# Patient Record
Sex: Female | Born: 1960 | Race: White | Hispanic: No | Marital: Married | Smoking: Never smoker
Health system: Southern US, Community
[De-identification: ages and names within clinical notes are randomized; demographics above are authoritative.]

---

## 2000-07-30 ENCOUNTER — Other Ambulatory Visit: Admission: RE | Admit: 2000-07-30 | Discharge: 2000-07-30 | Payer: Self-pay | Admitting: Obstetrics and Gynecology

## 2000-08-22 ENCOUNTER — Ambulatory Visit (HOSPITAL_COMMUNITY): Admission: RE | Admit: 2000-08-22 | Discharge: 2000-08-22 | Payer: Self-pay | Admitting: Obstetrics and Gynecology

## 2000-08-22 ENCOUNTER — Encounter: Payer: Self-pay | Admitting: Obstetrics and Gynecology

## 2000-09-06 ENCOUNTER — Ambulatory Visit (HOSPITAL_COMMUNITY): Admission: RE | Admit: 2000-09-06 | Discharge: 2000-09-06 | Payer: Self-pay | Admitting: Obstetrics and Gynecology

## 2000-11-22 ENCOUNTER — Encounter: Payer: Self-pay | Admitting: Family Medicine

## 2000-11-22 ENCOUNTER — Ambulatory Visit (HOSPITAL_COMMUNITY): Admission: RE | Admit: 2000-11-22 | Discharge: 2000-11-22 | Payer: Self-pay | Admitting: Family Medicine

## 2001-08-01 ENCOUNTER — Other Ambulatory Visit: Admission: RE | Admit: 2001-08-01 | Discharge: 2001-08-01 | Payer: Self-pay | Admitting: Obstetrics and Gynecology

## 2002-09-11 ENCOUNTER — Other Ambulatory Visit: Admission: RE | Admit: 2002-09-11 | Discharge: 2002-09-11 | Payer: Self-pay | Admitting: Obstetrics and Gynecology

## 2003-07-23 ENCOUNTER — Ambulatory Visit (HOSPITAL_COMMUNITY): Admission: RE | Admit: 2003-07-23 | Discharge: 2003-07-23 | Payer: Self-pay | Admitting: Family Medicine

## 2003-09-24 ENCOUNTER — Other Ambulatory Visit: Admission: RE | Admit: 2003-09-24 | Discharge: 2003-09-24 | Payer: Self-pay | Admitting: Obstetrics and Gynecology

## 2004-09-06 ENCOUNTER — Ambulatory Visit (HOSPITAL_BASED_OUTPATIENT_CLINIC_OR_DEPARTMENT_OTHER): Admission: RE | Admit: 2004-09-06 | Discharge: 2004-09-06 | Payer: Self-pay | Admitting: Orthopedic Surgery

## 2004-11-15 ENCOUNTER — Other Ambulatory Visit: Admission: RE | Admit: 2004-11-15 | Discharge: 2004-11-15 | Payer: Self-pay | Admitting: Obstetrics and Gynecology

## 2005-04-19 ENCOUNTER — Other Ambulatory Visit: Admission: RE | Admit: 2005-04-19 | Discharge: 2005-04-19 | Payer: Self-pay | Admitting: Obstetrics and Gynecology

## 2005-11-16 ENCOUNTER — Other Ambulatory Visit: Admission: RE | Admit: 2005-11-16 | Discharge: 2005-11-16 | Payer: Self-pay | Admitting: Obstetrics and Gynecology

## 2006-01-22 ENCOUNTER — Ambulatory Visit (HOSPITAL_COMMUNITY): Admission: RE | Admit: 2006-01-22 | Discharge: 2006-01-23 | Payer: Self-pay | Admitting: Obstetrics and Gynecology

## 2011-06-26 ENCOUNTER — Other Ambulatory Visit: Payer: Self-pay | Admitting: Family Medicine

## 2013-10-20 ENCOUNTER — Other Ambulatory Visit: Payer: Self-pay | Admitting: Obstetrics & Gynecology

## 2014-02-22 ENCOUNTER — Other Ambulatory Visit: Payer: Self-pay | Admitting: Family Medicine

## 2014-02-22 ENCOUNTER — Ambulatory Visit
Admission: RE | Admit: 2014-02-22 | Discharge: 2014-02-22 | Disposition: A | Discharge status: Home / Self Care | Payer: BC Managed Care – PPO | Source: Ambulatory Visit | Attending: Family Medicine | Admitting: Family Medicine

## 2014-02-22 DIAGNOSIS — S6390XA Sprain of unspecified part of unspecified wrist and hand, initial encounter: Secondary | ICD-10-CM

## 2015-04-27 IMAGING — CR DG HAND COMPLETE 3+V*R*
3 series · 3 of 3 positions shown · non-contrast
Comparison: None

CLINICAL DATA: Kicked by a horse 2 days ago, pain and swelling
across top of hand, sprain and strain

EXAM:
RIGHT HAND - COMPLETE 3+ VIEW

[view not recorded (1 of 3)]
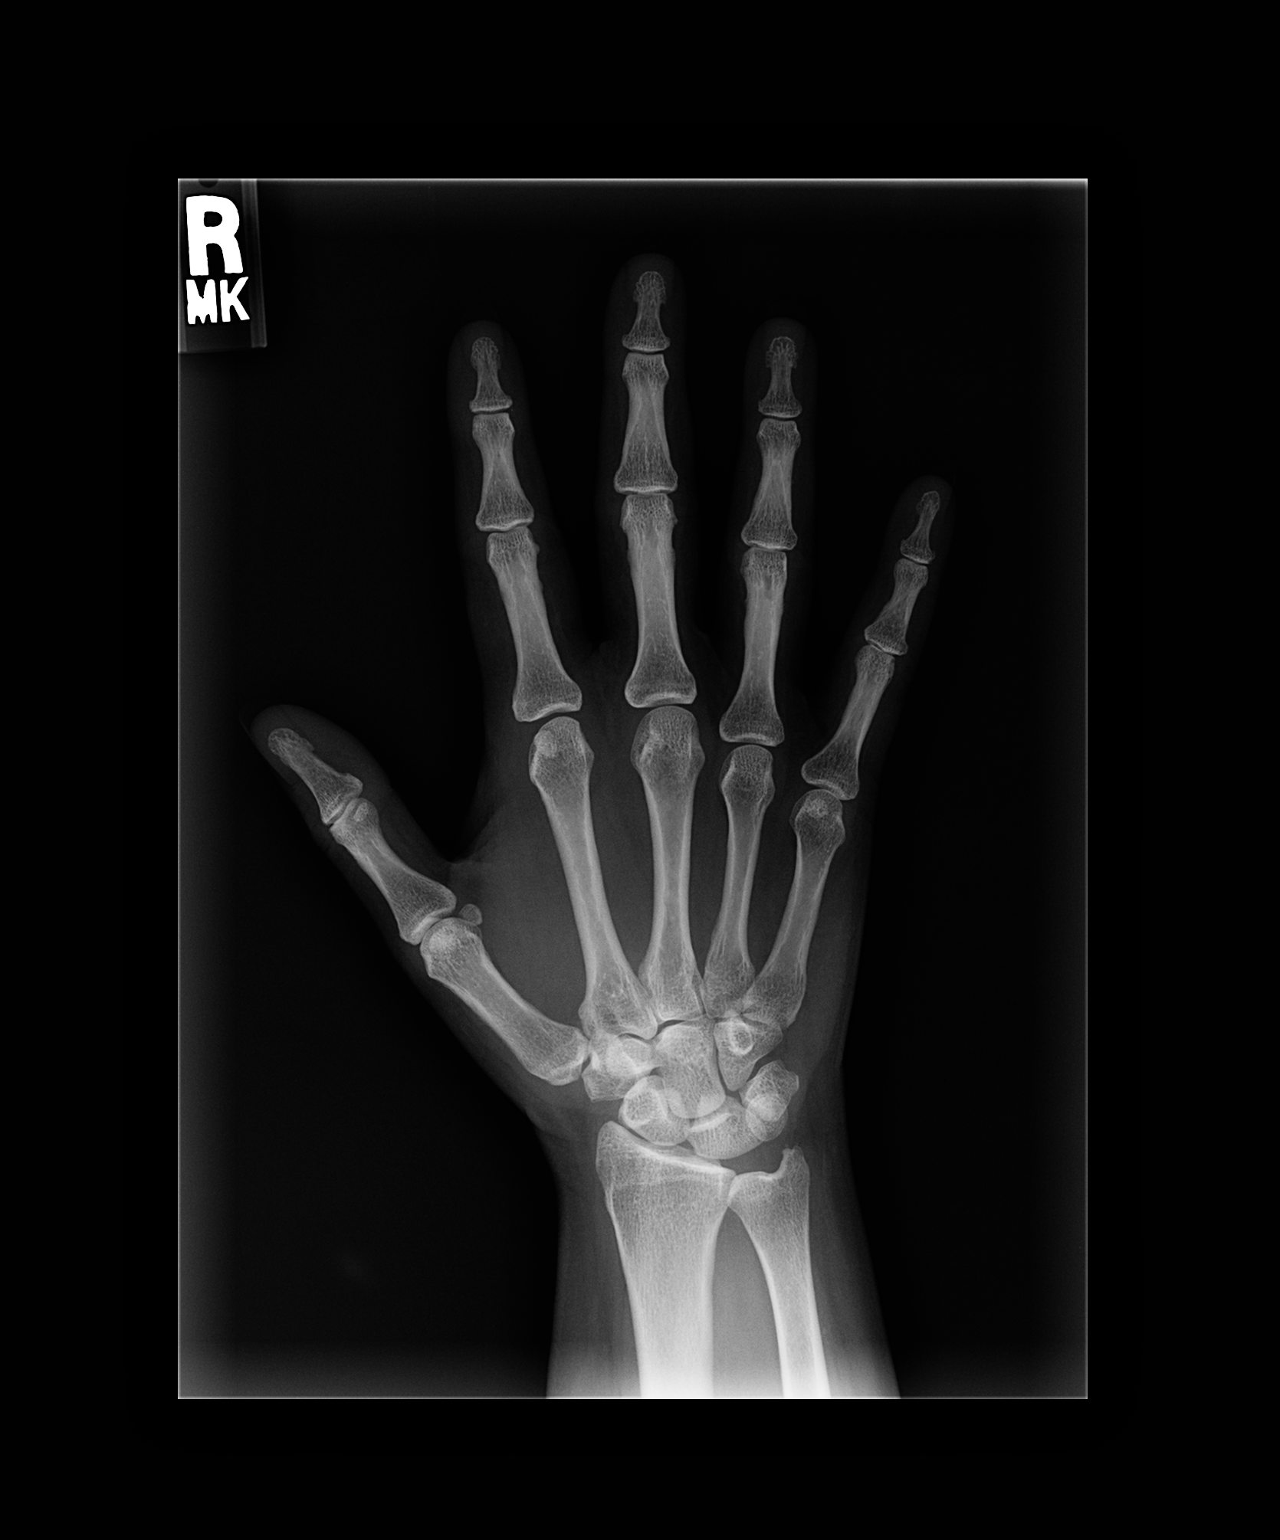

[view not recorded (2 of 3)]
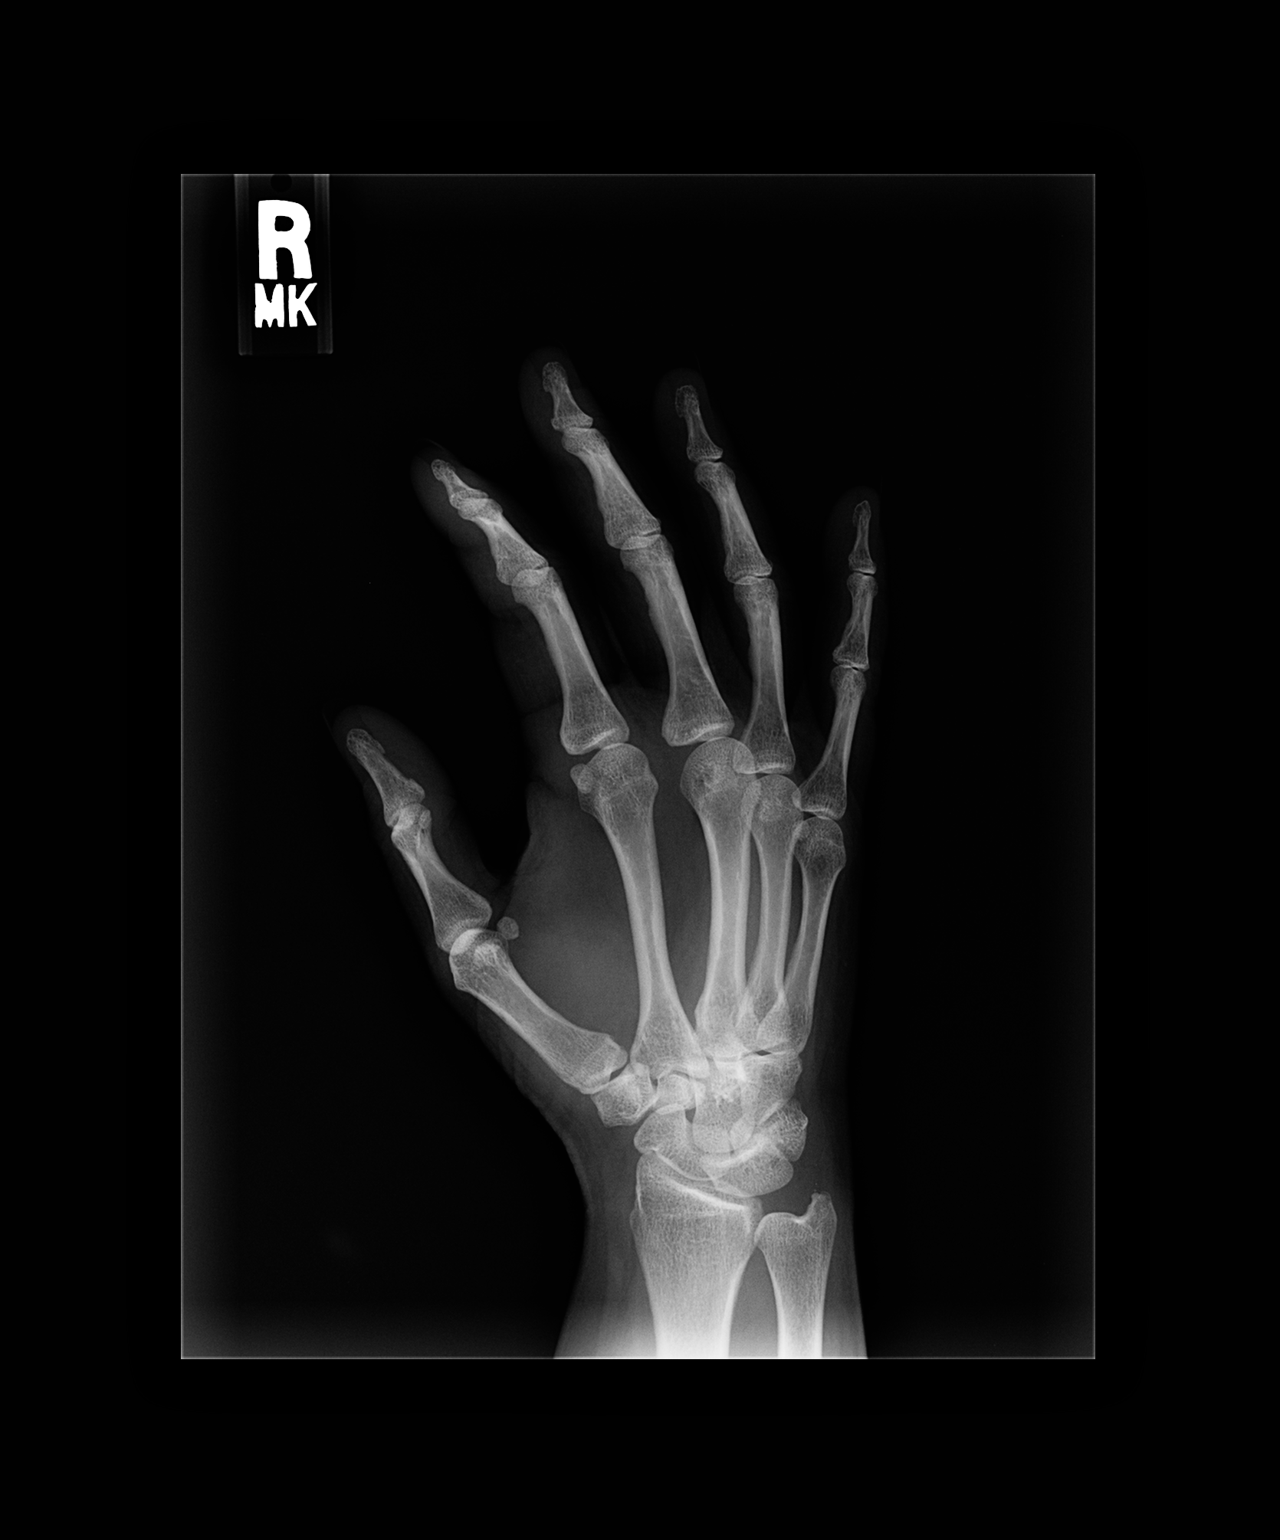

[view not recorded (3 of 3)]
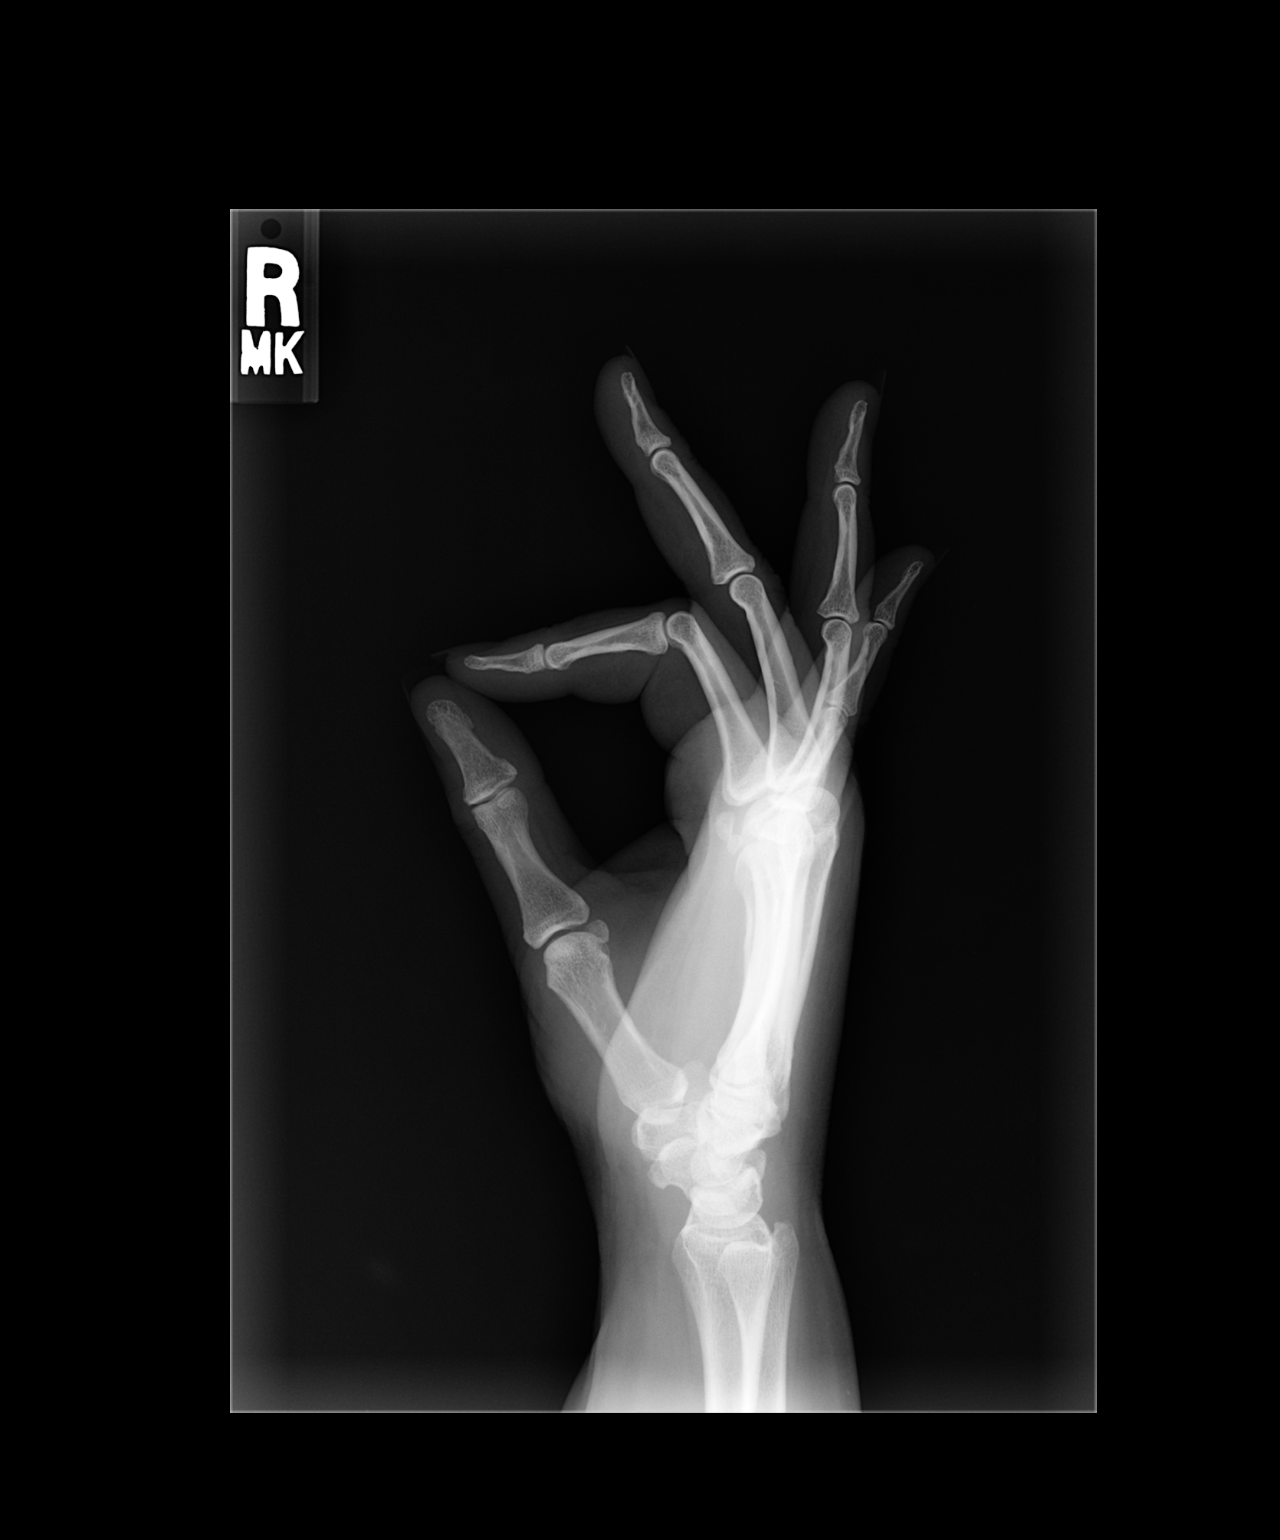

[3 of 3 positions shown; findings below may reference images not displayed]

FINDINGS: Mild dorsal soft tissue swelling and mid hand.

Osseous mineralization normal.

Joint spaces preserved.
IMPRESSION: No acute osseous abnormalities.

## 2018-02-27 ENCOUNTER — Ambulatory Visit (INDEPENDENT_AMBULATORY_CARE_PROVIDER_SITE_OTHER): Payer: PRIVATE HEALTH INSURANCE | Admitting: Orthopedic Surgery

## 2018-02-27 ENCOUNTER — Ambulatory Visit (INDEPENDENT_AMBULATORY_CARE_PROVIDER_SITE_OTHER): Payer: Self-pay

## 2018-02-27 ENCOUNTER — Encounter (INDEPENDENT_AMBULATORY_CARE_PROVIDER_SITE_OTHER): Payer: Self-pay | Admitting: Orthopedic Surgery

## 2018-02-27 DIAGNOSIS — M2022 Hallux rigidus, left foot: Secondary | ICD-10-CM | POA: Diagnosis not present

## 2018-02-27 DIAGNOSIS — M79672 Pain in left foot: Secondary | ICD-10-CM | POA: Diagnosis not present

## 2018-02-27 NOTE — Progress Notes (Signed)
   Office Visit Note   Patient: Brianna Johnson           Date of Birth: 03/22/1961           MRN: [id number] Visit Date: 02/27/2018              Requested by: Johny BlamerHarris, William, MD [phone] [address] [place], [place] [id number] PCP: Johny BlamerHarris, William, MD  Chief Complaint  Patient presents with  . Left Foot - Pain    S/p left GT cheilectomy 02/24/13      HPI: Patient is a 57 year old woman who presents approximately 5 years status post cheilectomy for hallux rigidus left great toe patient states she is been having increasing pain she states that her symptoms are minimal with wearing a stiff soled shoe she states she feels like she has some bone spurs around the toe joint.  Past medical history is updated she states she has been diagnosed with prediabetes.  Assessment & Plan: Visit Diagnoses:  1. Pain in left foot   2. Hallux rigidus, left foot     Plan: Recommend continue with a stiff soled shoes and Achilles stretching.  Discussed that her symptoms worsen we could proceed with a fusion of the MTP joint.  Discussed that she would be out of work for approximately a month.  Follow-Up Instructions: Return if symptoms worsen or fail to improve.   Ortho Exam  Patient is alert, oriented, no adenopathy, well-dressed, normal affect, normal respiratory effort. Examination patient has good pulses she does have new spurring around the MTP joint.  She has about 45 degrees of dorsiflexion about 30 degrees of plantarflexion.  There is no redness no cellulitis no signs of infection she has a normal gait she has good stiff soled shoes she has dorsiflexion about 20 degrees past neutral.  Imaging: No results found. No images are attached to the encounter.  Labs: No results found for: HGBA1C, ESRSEDRATE, CRP, LABURIC, REPTSTATUS, GRAMSTAIN, CULT, LABORGA   No results found for: ALBUMIN, PREALBUMIN, LABURIC  There is no height or weight on file to calculate BMI.  Orders:  Orders Placed  This Encounter  Procedures  . XR Foot 2 Views Left   No orders of the defined types were placed in this encounter.    Procedures: No procedures performed  Clinical Data: No additional findings.  ROS:  All other systems negative, except as noted in the HPI. Review of Systems  Objective: Vital Signs: There were no vitals taken for this visit.  Specialty Comments:  No specialty comments available.  PMFS History: Patient Active Problem List   Diagnosis Date Noted  . Hallux rigidus, left foot 02/27/2018   History reviewed. No pertinent past medical history.  History reviewed. No pertinent family history.  History reviewed. No pertinent surgical history. Social History   Occupational History  . Not on file  Tobacco Use  . Smoking status: Not on file  Substance and Sexual Activity  . Alcohol use: Not on file  . Drug use: Not on file  . Sexual activity: Not on file

## 2019-10-26 ENCOUNTER — Other Ambulatory Visit: Payer: Self-pay

## 2019-10-26 ENCOUNTER — Ambulatory Visit (INDEPENDENT_AMBULATORY_CARE_PROVIDER_SITE_OTHER): Payer: PRIVATE HEALTH INSURANCE

## 2019-10-26 ENCOUNTER — Encounter: Payer: Self-pay | Admitting: Orthopedic Surgery

## 2019-10-26 ENCOUNTER — Ambulatory Visit (INDEPENDENT_AMBULATORY_CARE_PROVIDER_SITE_OTHER): Payer: PRIVATE HEALTH INSURANCE | Admitting: Orthopedic Surgery

## 2019-10-26 DIAGNOSIS — M2022 Hallux rigidus, left foot: Secondary | ICD-10-CM | POA: Diagnosis not present

## 2019-10-26 DIAGNOSIS — M79672 Pain in left foot: Secondary | ICD-10-CM | POA: Diagnosis not present

## 2019-10-26 NOTE — Progress Notes (Signed)
   Office Visit Note   Patient: Brianna Johnson           Date of Birth: 07-27-1960           MRN: [id number] Visit Date: 10/26/2019              Requested by: Johny Blamer, MD [phone] [address],  [place] [address] PCP: Johny Blamer, MD  Chief Complaint  Patient presents with  . Left Foot - Pain      HPI: Patient is a 59 year old woman who is status post cheilectomy in 2014 was last seen in the office in 2019 and has been having increasing pain at the MTP joint.  She states the pain got worse when a patient stepped on her foot she complains of swelling.  She is taking anti-inflammatories as needed.  Assessment & Plan: Visit Diagnoses:  1. Pain in left foot   2. Hallux rigidus, left foot     Plan: Discussed that she can continue with her stiff soled shoes and once this gets to a point where she cannot perform her activities of daily living we could proceed with a fusion of the MTP joint.  Risk and benefits were discussed including infection persistent pain need for additional surgery.  Discussed that she would be able to return to work on her feet in about 6 weeks after surgery.  Follow-Up Instructions: Return if symptoms worsen or fail to improve.   Ortho Exam  Patient is alert, oriented, no adenopathy, well-dressed, normal affect, normal respiratory effort. Examination patient has a good dorsalis pedis pulse she has plantar flexion of about 10 degrees dorsiflexion about 20 degrees she has large bony spurs over the MTP joint and this is tender to palpation there is no redness no cellulitis no signs of infection there is no varus or valgus deformity.  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  No images are attached to the encounter.  Labs: No results found for: HGBA1C, ESRSEDRATE, CRP, LABURIC,  REPTSTATUS, GRAMSTAIN, CULT, LABORGA   No results found for: ALBUMIN, PREALBUMIN, LABURIC  No results found for: MG No results found for: VD25OH  No results found for: PREALBUMIN No flowsheet data found.   There is no height or weight on file to calculate BMI.  Orders:  Orders Placed This Encounter  Procedures  . XR Foot Complete Left   No orders of the defined types were placed in this encounter.    Procedures: No procedures performed  Clinical Data: No additional findings.  ROS:  All other systems negative, except as noted in the HPI. Review of Systems  Objective: Vital Signs: There were no vitals taken for this visit.  Specialty Comments:  No specialty comments available.  PMFS History: Patient Active Problem List   Diagnosis Date Noted  . Hallux rigidus, left foot 02/27/2018   History reviewed. No pertinent past medical history.  History reviewed. No pertinent family history.  History reviewed. No pertinent surgical history. Social History   Occupational History  . Not on file  Tobacco Use  . Smoking status: Never Smoker  . Smokeless tobacco: Never Used  Substance and Sexual Activity  . Alcohol use: Not on file  . Drug use: Not on file  . Sexual activity: Not on file
